# Patient Record
Sex: Male | Born: 1970 | Race: White | Hispanic: No | Marital: Single | Smoking: Never smoker
Health system: Southern US, Community
[De-identification: ages and names within clinical notes are randomized; demographics above are authoritative.]

---

## 1997-12-19 ENCOUNTER — Inpatient Hospital Stay (HOSPITAL_COMMUNITY): Admission: AD | Admit: 1997-12-19 | Discharge: 1997-12-22 | Payer: Self-pay | Admitting: Orthopedic Surgery

## 1998-11-30 ENCOUNTER — Encounter: Payer: Self-pay | Admitting: Emergency Medicine

## 1998-11-30 ENCOUNTER — Emergency Department (HOSPITAL_COMMUNITY): Admission: EM | Admit: 1998-11-30 | Discharge: 1998-11-30 | Payer: Self-pay | Admitting: Emergency Medicine

## 1999-01-01 ENCOUNTER — Emergency Department (HOSPITAL_COMMUNITY): Admission: EM | Admit: 1999-01-01 | Discharge: 1999-01-01 | Payer: Self-pay | Admitting: *Deleted

## 2001-05-16 ENCOUNTER — Encounter: Payer: Self-pay | Admitting: Emergency Medicine

## 2001-05-16 ENCOUNTER — Emergency Department (HOSPITAL_COMMUNITY): Admission: EM | Admit: 2001-05-16 | Discharge: 2001-05-16 | Payer: Self-pay | Admitting: Emergency Medicine

## 2002-04-08 ENCOUNTER — Emergency Department (HOSPITAL_COMMUNITY): Admission: EM | Admit: 2002-04-08 | Discharge: 2002-04-09 | Payer: Self-pay | Admitting: Emergency Medicine

## 2002-04-08 ENCOUNTER — Encounter: Payer: Self-pay | Admitting: Emergency Medicine

## 2004-02-05 ENCOUNTER — Emergency Department (HOSPITAL_COMMUNITY): Admission: EM | Admit: 2004-02-05 | Discharge: 2004-02-05 | Payer: Self-pay | Admitting: *Deleted

## 2004-12-18 ENCOUNTER — Emergency Department (HOSPITAL_COMMUNITY): Admission: EM | Admit: 2004-12-18 | Discharge: 2004-12-18 | Payer: Self-pay | Admitting: Emergency Medicine

## 2006-04-17 IMAGING — CR DG TIBIA/FIBULA 2V*L*
4 series · 4 of 4 positions shown · non-contrast
Comparison: none

CLINICAL DATA: Fell off truck--medial leg pain.
 LEFT TIBIA/FIBULA TWO VIEWS
 There is no evidence of fracture or dislocation. No other significant bone or soft tissue abnormalities are identified.

 IMPRESSION
 Normal study.

[view not recorded (1 of 4)]
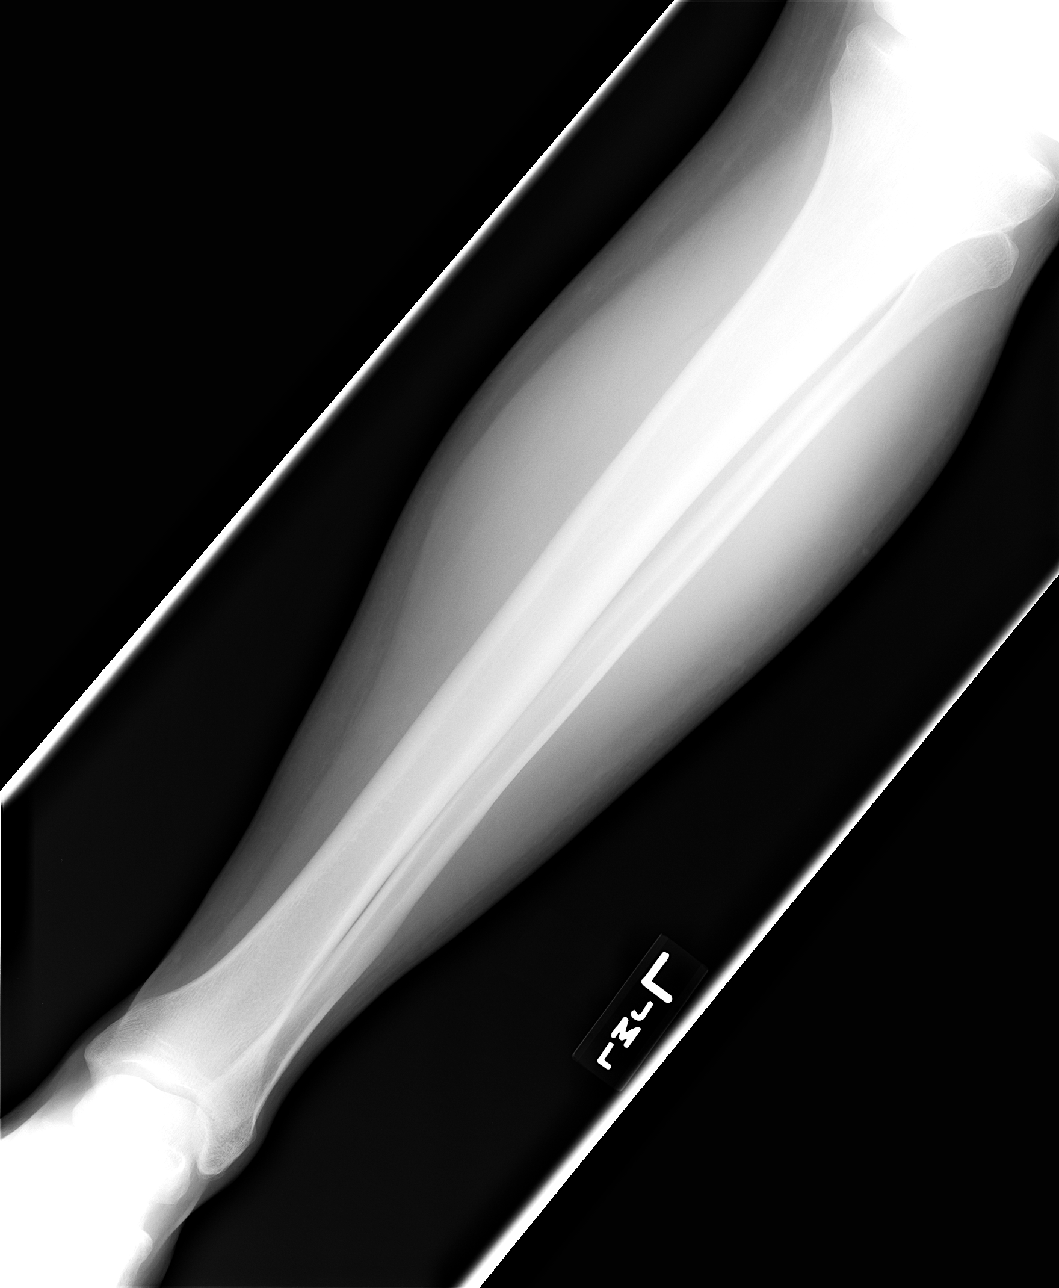

[view not recorded (2 of 4)]
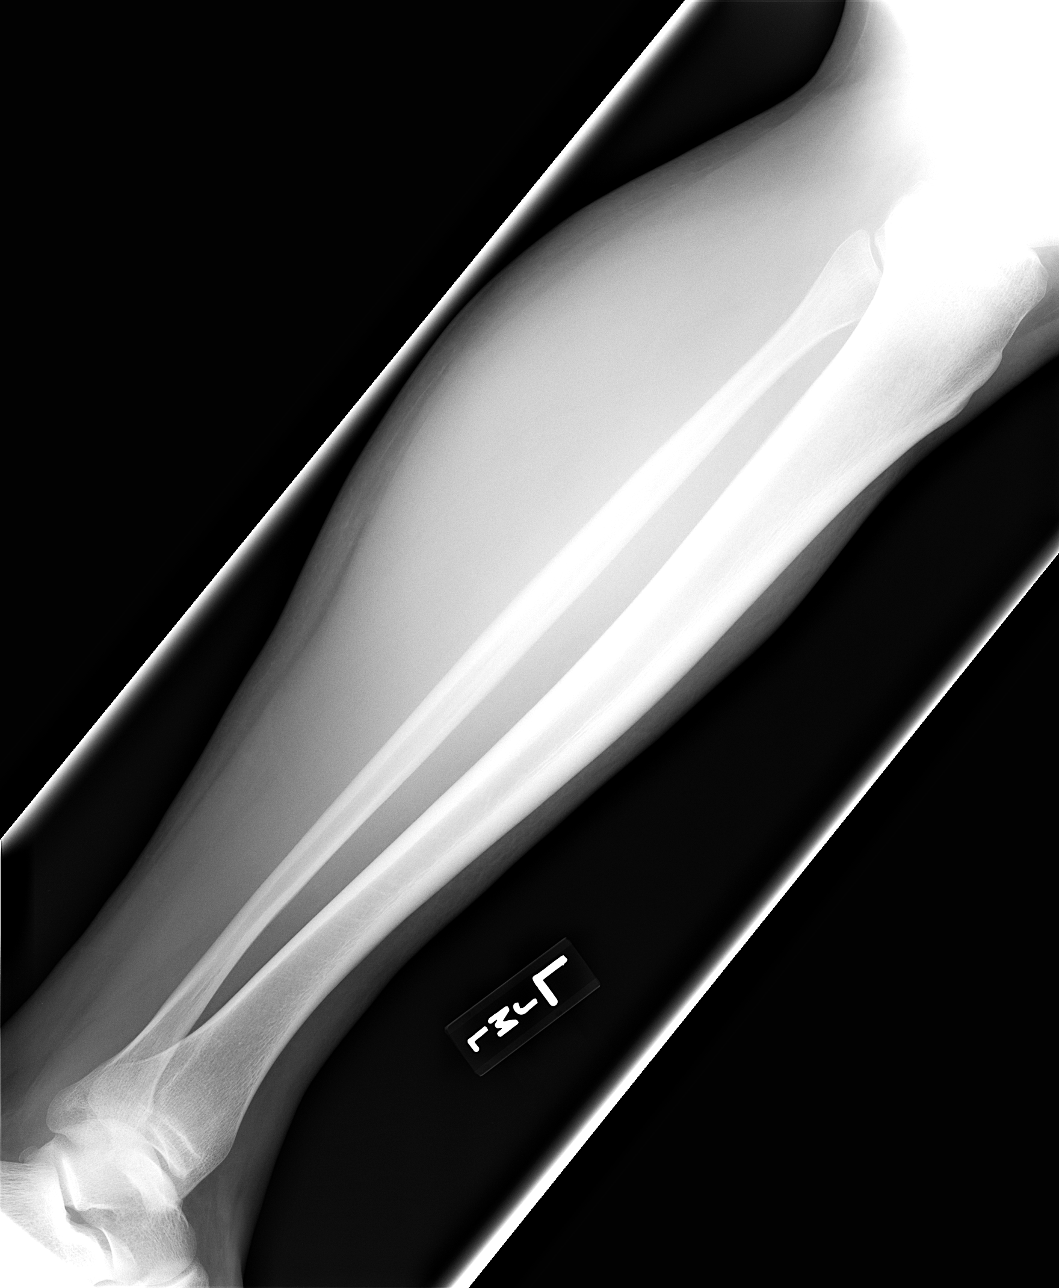

[view not recorded (3 of 4)]
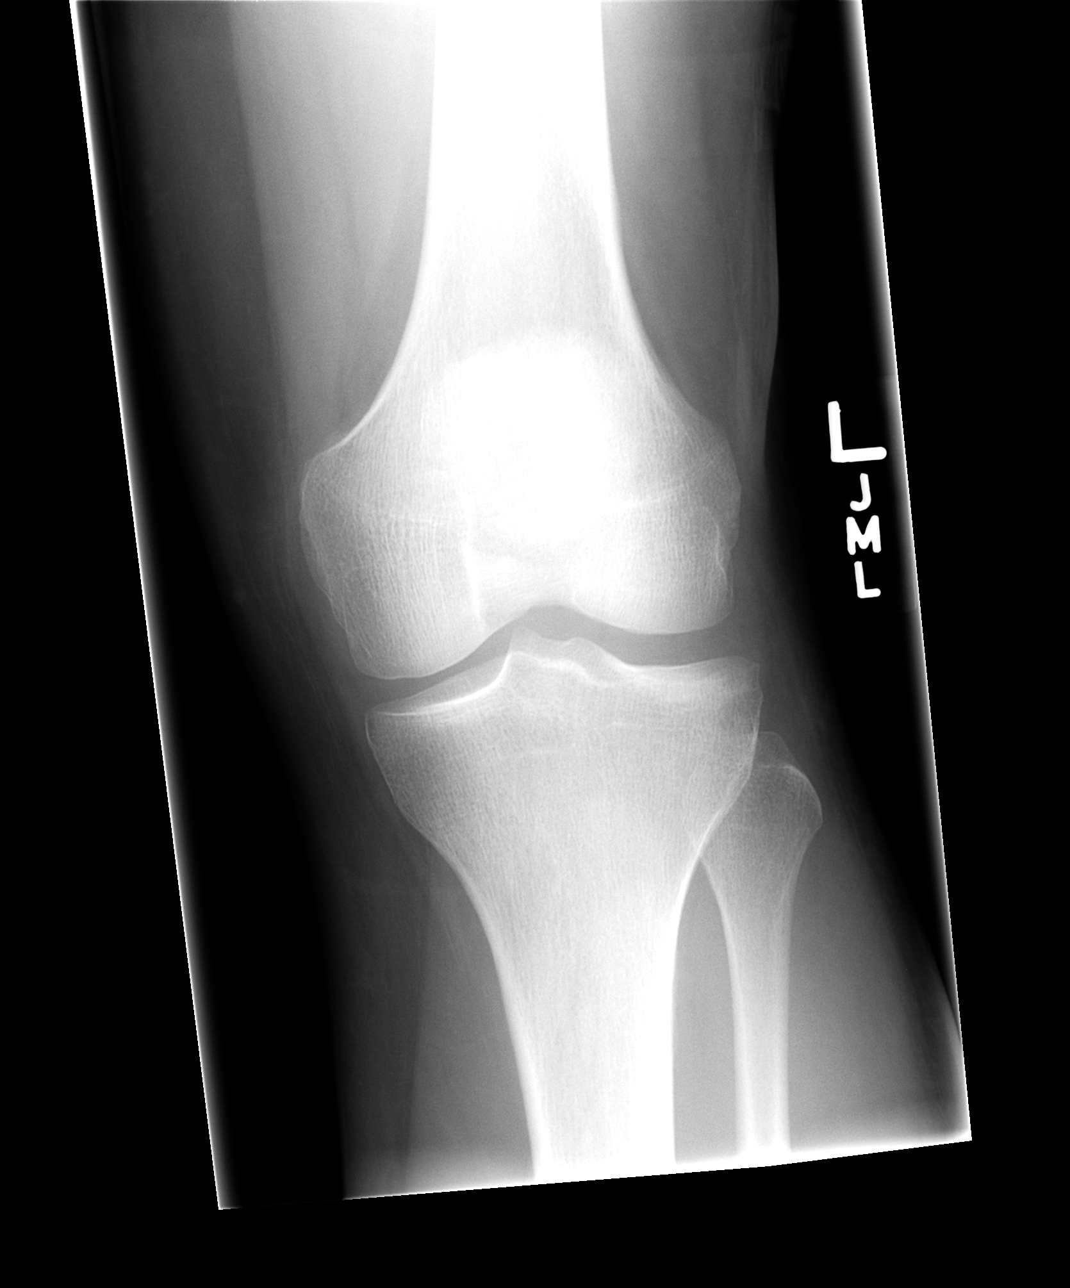

[view not recorded (4 of 4)]
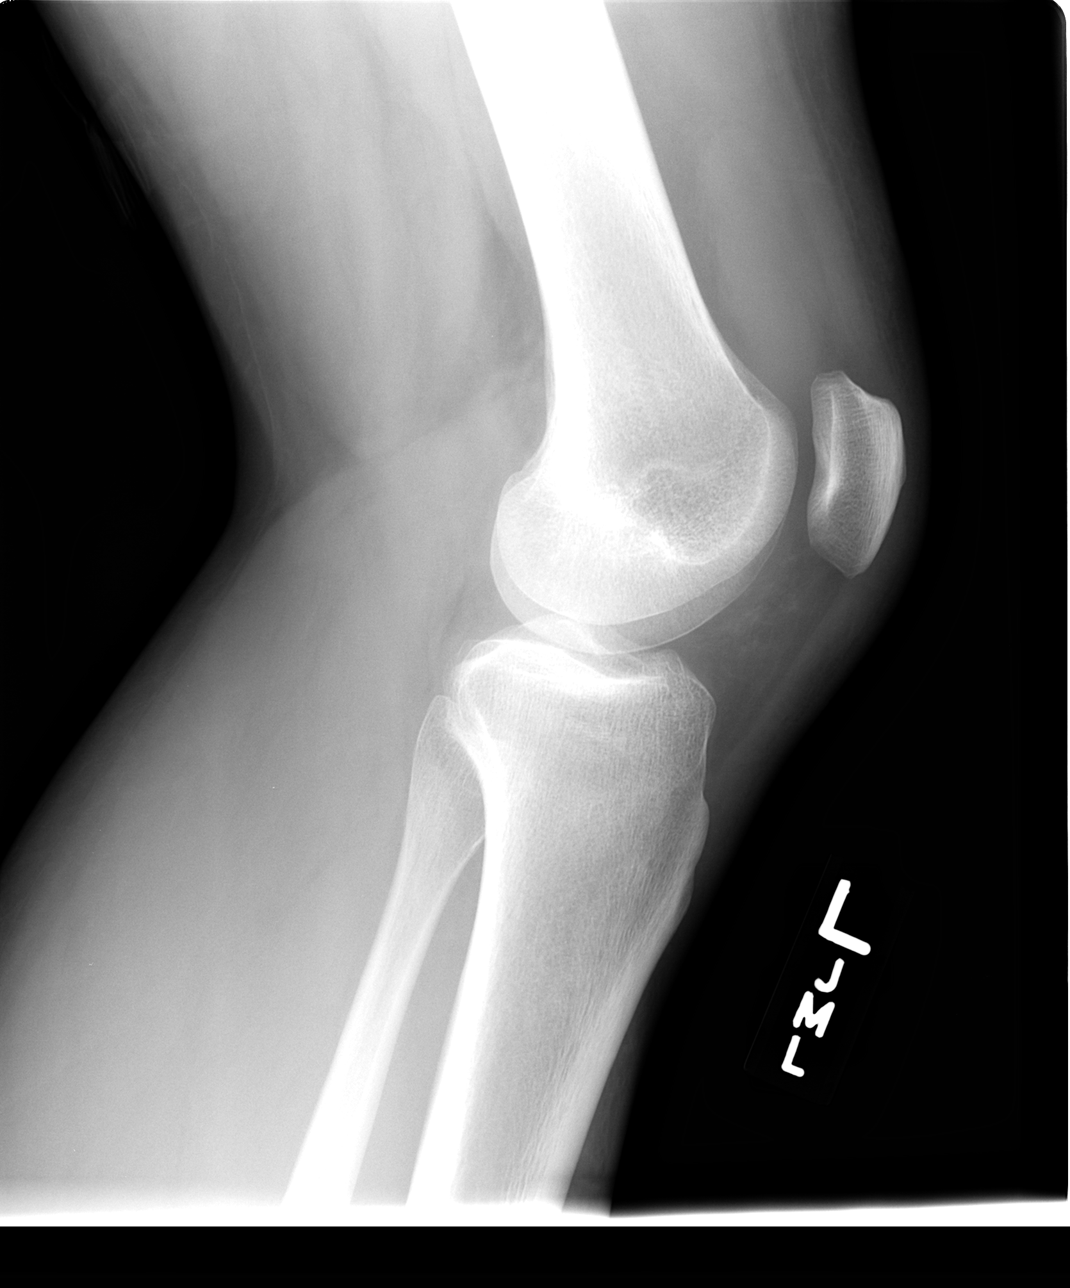

[4 of 4 positions shown; findings below may reference images not displayed]

## 2013-12-22 ENCOUNTER — Encounter (HOSPITAL_COMMUNITY): Payer: Self-pay | Admitting: Emergency Medicine

## 2013-12-22 ENCOUNTER — Emergency Department (HOSPITAL_COMMUNITY)
Admission: EM | Admit: 2013-12-22 | Discharge: 2013-12-22 | Disposition: A | Discharge status: Home / Self Care | Payer: BC Managed Care – PPO | Source: Home / Self Care | Attending: Family Medicine | Admitting: Family Medicine

## 2013-12-22 DIAGNOSIS — L03119 Cellulitis of unspecified part of limb: Secondary | ICD-10-CM

## 2013-12-22 DIAGNOSIS — L02419 Cutaneous abscess of limb, unspecified: Secondary | ICD-10-CM

## 2013-12-22 DIAGNOSIS — G5711 Meralgia paresthetica, right lower limb: Secondary | ICD-10-CM

## 2013-12-22 DIAGNOSIS — L03115 Cellulitis of right lower limb: Secondary | ICD-10-CM

## 2013-12-22 DIAGNOSIS — G5710 Meralgia paresthetica, unspecified lower limb: Secondary | ICD-10-CM

## 2013-12-22 MED ORDER — CEFUROXIME AXETIL 500 MG PO TABS: 500.0000 mg | ORAL_TABLET | Freq: Two times a day (BID) | ORAL | Status: AC

## 2013-12-22 MED ORDER — DOXYCYCLINE HYCLATE 100 MG PO CAPS: 100.0000 mg | ORAL_CAPSULE | Freq: Two times a day (BID) | ORAL | Status: AC

## 2013-12-22 NOTE — Discharge Instructions (Signed)
Thank you for coming in today. Take both doxycycline and ceftin twice dialy for 1 week.  Come back if not better.  Go to the emergency room if worse.  Follow up with your doctor.   For your leg tingling wear looser pants and use suspenders. Try to lose the belly as well.  If it never gets better see your doctor.   Cellulitis Cellulitis is an infection of the skin and the tissue beneath it. The infected area is usually red and tender. Cellulitis occurs most often in the arms and lower legs.  CAUSES  Cellulitis is caused by bacteria that enter the skin through cracks or cuts in the skin. The most common types of bacteria that cause cellulitis are staphylococci and streptococci. SIGNS AND SYMPTOMS   Redness and warmth.  Swelling.  Tenderness or pain.  Fever. DIAGNOSIS  Your health care provider can usually determine what is wrong based on a physical exam. Blood tests may also be done. TREATMENT  Treatment usually involves taking an antibiotic medicine. HOME CARE INSTRUCTIONS   Take your antibiotic medicine as directed by your health care provider. Finish the antibiotic even if you start to feel better.  Keep the infected arm or leg elevated to reduce swelling.  Apply a warm cloth to the affected area up to 4 times per day to relieve pain.  Take medicines only as directed by your health care provider.  Keep all follow-up visits as directed by your health care provider. SEEK MEDICAL CARE IF:   You notice red streaks coming from the infected area.  Your red area gets larger or turns dark in color.  Your bone or joint underneath the infected area becomes painful after the skin has healed.  Your infection returns in the same area or another area.  You notice a swollen bump in the infected area.  You develop new symptoms.  You have a fever. SEEK IMMEDIATE MEDICAL CARE IF:   You feel very sleepy.  You develop vomiting or diarrhea.  You have a general ill feeling  (malaise) with muscle aches and pains. MAKE SURE YOU:   Understand these instructions.  Will watch your condition.  Will get help right away if you are not doing well or get worse. Document Released: 02/19/2005 Document Revised: 09/26/2013 Document Reviewed: 07/28/2011 [facility] Patient Information 2015 [employer] [place]. This information is not intended to replace advice given to you by your health care provider. Make sure you discuss any questions you have with your health care provider.

## 2013-12-22 NOTE — ED Notes (Signed)
Reports possible insect bite to right knee noticed 1 wk ago.  Area is red and tender to touch.  Denies irritation.

## 2013-12-22 NOTE — ED Provider Notes (Signed)
Marvin Reyes is a 43 y.o. male who presents to Urgent Care today for right knee pain.  1) patient has redness and mild pain at the anterior aspect of his right knee. This is been present for one week. He is able to express a small amount of dark material. He denies any fevers or chills nausea vomiting or diarrhea. No knee swelling or pain with motion. 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 He denies any tick bites. He took some leftover unknown antibiotics a few days ago which did not help. 2) patient additionally notes a vague numbness and tingling sensation to his right lateral leg. This is been present for months. He denies any weakness or bowel bladder dysfunction or difficulty walking.   History reviewed. No pertinent past medical history. History  Substance Use Topics  . Smoking status: Never Smoker   . Smokeless tobacco: Not on file  . Alcohol Use: No   ROS as above Medications: No current facility-administered medications for this encounter.   Current Outpatient Prescriptions  Medication Sig Dispense Refill  . cefUROXime (CEFTIN) 500 MG tablet Take 1 tablet (500 mg total) by mouth 2 (two) times daily with a meal.  14 tablet  0  . doxycycline (VIBRAMYCIN) 100 MG capsule Take 1 capsule (100 mg total) by mouth 2 (two) times daily.  14 capsule  0    Exam:  BP 148/95  Pulse 81  Temp(Src) 98 F (36.7 C) (Oral)  Resp 12  SpO2 99% Gen: Well NAD HEENT: EOMI,  MMM Lungs: Normal work of breathing. CTABL Heart: RRR no MRG Abd: NABS, Soft. Nondistended, Nontender obese abdomen Exts: Brisk capillary refill, warm and well perfused.  Skin: 5 cm mild erythema with central induration right lateral proximal lower leg. No pus expressible No knee effusion. No pain with any motion. Neuro: Numbness and tingling worsens with compression of the lateral femoral cutaneous nerve of the right side  No results found for this or any previous visit (from  the past 24 hour(s)). No results found.  Assessment and Plan: 43 y.o. male with  1) cellulitis: Right leg. Plan to treat with Ceftin and doxycycline. Follow up with primary care provider. 2) meralgia paresthetica: Advise weight loss suspect years and followup with primary care Dr.  Discussed warning signs or symptoms. Please see discharge instructions. Patient expresses understanding.   This note was created using Conservation officer, historic buildingsDragon voice recognition software. Any transcription errors are unintended.    Rodolph BongEvan S Kenita Bines, MD 12/22/13 [phone]
